# Patient Record
Sex: Female | Born: 1970 | Race: White | Hispanic: No | Marital: Married | Smoking: Never smoker
Health system: Southern US, Community
[De-identification: ages and names within clinical notes are randomized; demographics above are authoritative.]

---

## 1998-12-17 ENCOUNTER — Other Ambulatory Visit: Admission: RE | Admit: 1998-12-17 | Discharge: 1998-12-17 | Payer: Self-pay | Admitting: *Deleted

## 2000-02-09 ENCOUNTER — Other Ambulatory Visit: Admission: RE | Admit: 2000-02-09 | Discharge: 2000-02-09 | Payer: Self-pay | Admitting: Obstetrics & Gynecology

## 2001-02-08 ENCOUNTER — Other Ambulatory Visit: Admission: RE | Admit: 2001-02-08 | Discharge: 2001-02-08 | Payer: Self-pay | Admitting: Obstetrics & Gynecology

## 2001-07-31 ENCOUNTER — Encounter: Admission: RE | Admit: 2001-07-31 | Discharge: 2001-10-29 | Payer: Self-pay | Admitting: Family Medicine

## 2002-02-28 ENCOUNTER — Other Ambulatory Visit: Admission: RE | Admit: 2002-02-28 | Discharge: 2002-02-28 | Payer: Self-pay | Admitting: Obstetrics & Gynecology

## 2003-05-25 ENCOUNTER — Other Ambulatory Visit: Admission: RE | Admit: 2003-05-25 | Discharge: 2003-05-25 | Payer: Self-pay | Admitting: Obstetrics & Gynecology

## 2007-07-31 ENCOUNTER — Ambulatory Visit (HOSPITAL_COMMUNITY): Admission: RE | Admit: 2007-07-31 | Discharge: 2007-07-31 | Payer: Self-pay | Admitting: Obstetrics & Gynecology

## 2011-03-14 ENCOUNTER — Inpatient Hospital Stay (HOSPITAL_COMMUNITY): Payer: Managed Care, Other (non HMO)

## 2011-03-14 ENCOUNTER — Inpatient Hospital Stay (HOSPITAL_COMMUNITY)
Admission: AD | Admit: 2011-03-14 | Discharge: 2011-03-23 | DRG: 775 | Disposition: A | Discharge status: Home / Self Care | Payer: Managed Care, Other (non HMO) | Source: Ambulatory Visit | Attending: Obstetrics and Gynecology | Admitting: Obstetrics and Gynecology

## 2011-03-14 DIAGNOSIS — N312 Flaccid neuropathic bladder, not elsewhere classified: Secondary | ICD-10-CM | POA: Diagnosis not present

## 2011-03-14 DIAGNOSIS — O09529 Supervision of elderly multigravida, unspecified trimester: Secondary | ICD-10-CM | POA: Diagnosis present

## 2011-03-14 DIAGNOSIS — O4290 Premature rupture of membranes, unspecified as to length of time between rupture and onset of labor, unspecified weeks of gestation: Principal | ICD-10-CM | POA: Diagnosis present

## 2011-03-14 LAB — DIFFERENTIAL
Basophils Relative: 0 % (ref 0–1)
Blasts: 0 %
Eosinophils Relative: 0 % (ref 0–5)
Lymphs Abs: 1.5 10*3/uL (ref 0.7–4.0)
Monocytes Absolute: 0.5 10*3/uL (ref 0.1–1.0)
Neutrophils Relative %: 73 % (ref 43–77)
Promyelocytes Absolute: 0 %

## 2011-03-14 LAB — CBC
HCT: 37.2 % (ref 36.0–46.0)
MCH: 31.6 pg (ref 26.0–34.0)
MCHC: 33.3 g/dL (ref 30.0–36.0)
Platelets: 223 10*3/uL (ref 150–400)
WBC: 7.7 10*3/uL (ref 4.0–10.5)

## 2011-03-15 LAB — URINE CULTURE
Colony Count: NO GROWTH
Culture: NO GROWTH

## 2011-03-15 LAB — DIFFERENTIAL
Eosinophils Absolute: 0 10*3/uL (ref 0.0–0.7)
Eosinophils Relative: 0 % (ref 0–5)
Lymphocytes Relative: 13 % (ref 12–46)
Monocytes Absolute: 0.6 10*3/uL (ref 0.1–1.0)
Monocytes Relative: 6 % (ref 3–12)
Neutro Abs: 8.4 10*3/uL — ABNORMAL HIGH (ref 1.7–7.7)
Neutrophils Relative %: 81 % — ABNORMAL HIGH (ref 43–77)

## 2011-03-15 LAB — CBC
MCHC: 32.9 g/dL (ref 30.0–36.0)
MCV: 95.1 fL (ref 78.0–100.0)
RDW: 12.9 % (ref 11.5–15.5)

## 2011-03-16 LAB — DIFFERENTIAL
Band Neutrophils: 2 % (ref 0–10)
Basophils Absolute: 0 K/uL (ref 0.0–0.1)
Basophils Relative: 0 % (ref 0–1)
Blasts: 0 %
Eosinophils Absolute: 0 K/uL (ref 0.0–0.7)
Eosinophils Relative: 0 % (ref 0–5)
Lymphocytes Relative: 22 % (ref 12–46)
Lymphs Abs: 2.5 K/uL (ref 0.7–4.0)
Metamyelocytes Relative: 0 %
Monocytes Absolute: 0.5 K/uL (ref 0.1–1.0)
Monocytes Relative: 4 % (ref 3–12)
Myelocytes: 0 %
Neutro Abs: 8.4 K/uL — ABNORMAL HIGH (ref 1.7–7.7)
Neutrophils Relative %: 72 % (ref 43–77)
Promyelocytes Absolute: 0 %
nRBC: 0 /100{WBCs}

## 2011-03-16 LAB — CBC
HCT: 32.7 % — ABNORMAL LOW (ref 36.0–46.0)
Hemoglobin: 10.8 g/dL — ABNORMAL LOW (ref 12.0–15.0)
Platelets: 222 10*3/uL (ref 150–400)

## 2011-03-16 LAB — STREP B DNA PROBE

## 2011-03-20 ENCOUNTER — Other Ambulatory Visit: Payer: Self-pay | Admitting: Obstetrics and Gynecology

## 2011-03-20 LAB — DIFFERENTIAL
Basophils Absolute: 0 10*3/uL (ref 0.0–0.1)
Basophils Relative: 0 % (ref 0–1)
Lymphocytes Relative: 23 % (ref 12–46)
Monocytes Absolute: 0.8 10*3/uL (ref 0.1–1.0)

## 2011-03-20 LAB — CBC
HCT: 37.9 % (ref 36.0–46.0)
MCH: 32.1 pg (ref 26.0–34.0)
MCHC: 34.3 g/dL (ref 30.0–36.0)
Platelets: 274 10*3/uL (ref 150–400)
RDW: 12.8 % (ref 11.5–15.5)

## 2011-03-21 LAB — CBC
HCT: 32.7 % — ABNORMAL LOW (ref 36.0–46.0)
Hemoglobin: 10.9 g/dL — ABNORMAL LOW (ref 12.0–15.0)
MCH: 31.6 pg (ref 26.0–34.0)
MCHC: 33.3 g/dL (ref 30.0–36.0)
MCV: 94.8 fL (ref 78.0–100.0)
RBC: 3.45 MIL/uL — ABNORMAL LOW (ref 3.87–5.11)
RDW: 12.9 % (ref 11.5–15.5)
WBC: 13.1 10*3/uL — ABNORMAL HIGH (ref 4.0–10.5)

## 2011-03-22 LAB — RH IMMUNE GLOB WKUP(>/=20WKS)(NOT WOMEN'S HOSP)
Antibody Screen: POSITIVE
DAT, IgG: NEGATIVE

## 2011-05-03 ENCOUNTER — Inpatient Hospital Stay (HOSPITAL_COMMUNITY): Admission: AD | Admit: 2011-05-03 | Payer: Self-pay | Source: Home / Self Care | Admitting: Obstetrics and Gynecology

## 2011-05-18 ENCOUNTER — Ambulatory Visit (HOSPITAL_COMMUNITY)
Admission: RE | Admit: 2011-05-18 | Discharge: 2011-05-18 | Disposition: A | Discharge status: Home / Self Care | Payer: Managed Care, Other (non HMO) | Source: Ambulatory Visit | Attending: Pediatrics | Admitting: Pediatrics

## 2011-05-18 NOTE — Progress Notes (Signed)
BABY:  Erin  Noble  WEIGHT TODAY  8LB 10OZ MOTHER:  Erin Noble  BABY WAS BORN AT 34 WEEKS AND NOW 52 WEEKS OLD.  MOTHER HAS BEEN PUMPING WITH GOOD MILK SUPPLY.  MOTHER C/O OF PINCHED, BLANCHED VERY SORE NIPPLES WHEN PUTTING BABY TO BREAST.  ASSISTED MOTHER WITH PROPER TECHNIQUE FOR POSITIONING AND LATCH IN CROSS CRADLE HOLD.  BABY LATCHED EASILY WITH DEEP LATCH, GREAT SUCK/SWALLOWS.  MOTHER STATES FEEDING IS COMFORTABLE AND NIPPLES NOT DISTORTED AFTER FEED.  BASIC BREASTFEEDING/PUMPING TEACHING DONE.  ENCOURAGED [name] TO CALL LC OFFICE PRN.

## 2015-02-26 ENCOUNTER — Ambulatory Visit: Payer: Managed Care, Other (non HMO) | Admitting: Cardiology

## 2015-12-24 ENCOUNTER — Other Ambulatory Visit: Payer: Self-pay | Admitting: Family Medicine

## 2015-12-24 DIAGNOSIS — R110 Nausea: Secondary | ICD-10-CM

## 2015-12-31 ENCOUNTER — Ambulatory Visit
Admission: RE | Admit: 2015-12-31 | Discharge: 2015-12-31 | Disposition: A | Discharge status: Home / Self Care | Payer: Managed Care, Other (non HMO) | Source: Ambulatory Visit | Attending: Family Medicine | Admitting: Family Medicine

## 2015-12-31 DIAGNOSIS — R110 Nausea: Secondary | ICD-10-CM

## 2016-03-15 ENCOUNTER — Encounter: Payer: Self-pay | Admitting: Endocrinology

## 2016-03-15 ENCOUNTER — Ambulatory Visit (INDEPENDENT_AMBULATORY_CARE_PROVIDER_SITE_OTHER): Payer: Managed Care, Other (non HMO) | Admitting: Endocrinology

## 2016-03-15 VITALS — BP 100/62 | HR 67 | Temp 98.5°F | Resp 14 | Ht 69.0 in | Wt 165.8 lb

## 2016-03-15 DIAGNOSIS — R5383 Other fatigue: Secondary | ICD-10-CM | POA: Diagnosis not present

## 2016-03-15 DIAGNOSIS — I9589 Other hypotension: Secondary | ICD-10-CM

## 2016-03-15 DIAGNOSIS — R531 Weakness: Secondary | ICD-10-CM | POA: Diagnosis not present

## 2016-03-15 LAB — RENAL FUNCTION PANEL
Albumin: 4.2 g/dL (ref 3.5–5.2)
BUN: 8 mg/dL (ref 6–23)
CHLORIDE: 105 meq/L (ref 96–112)
CO2: 29 mEq/L (ref 19–32)
Calcium: 9.2 mg/dL (ref 8.4–10.5)
Creatinine, Ser: 0.61 mg/dL (ref 0.40–1.20)
GFR: 112.22 mL/min (ref >60.00)
GLUCOSE: 90 mg/dL (ref 70–99)
PHOSPHORUS: 3.2 mg/dL (ref 2.3–4.6)
Potassium: 4 mEq/L (ref 3.5–5.1)
SODIUM: 139 meq/L (ref 135–145)

## 2016-03-15 LAB — T4, FREE: FREE T4: 0.7 ng/dL (ref 0.60–1.60)

## 2016-03-15 LAB — CORTISOL: CORTISOL PLASMA: 7.1 ug/dL

## 2016-03-15 NOTE — Progress Notes (Signed)
Quick Note:  Please let patient know that the lab result is normal and no further action needed ______ 

## 2016-03-15 NOTE — Progress Notes (Signed)
Patient ID: Erin Noble, female   DOB: [birth year], 45 y.o.   MRN: [id number]           Chief complaint: Episodes of nausea and fatigue  History of Present Illness:  Over the last couple of years she had had intermittent episodes of feeling nauseated and a little weight. Since March this year she has had much more frequent episodes starting initially with an episode that lasted for a few days. She usually starts having symptoms of feeling nauseated, shaky, very tired but not sweaty or lightheaded.  She apparently feels much better with eating any kind of food With having the symptoms she has started eating some food or snacks every 2 hours or so and with this she thinks she feels better. However even though she has occasional symptoms early morning she can sometimes feel fine with without any food when she is not supposed to eat for procedure when she came in today  Her PCP asked her to start checking her blood sugars at various times and these have been ranging between 84 and 116 with or without meals and not correlating with her symptoms Her symptoms are somewhat better overall except having some symptoms of feeling weak and tired at times and occasional fluttering of her heart Sometimes she will have symptoms even 30-60 minutes after a meal or snack which may contain protein   No past medical history on file.  No past surgical history on file.  Family History  Problem Relation Age of Onset  . Thyroid cancer Mother   . Diabetes Father     Social History:  reports that she has never smoked. She has never used smokeless tobacco. Her alcohol and drug histories are not on file.  Allergies:  Allergies  Allergen Reactions  . Codeine Other (See Comments)  . Erythromycin Nausea And Vomiting      Medication List       This list is accurate as of: 03/15/16 11:59 PM.  Always use your most recent med list.               ALPRAZolam 0.25 MG tablet  Commonly known as:  XANAX       ALPRAZolam 0.5 MG tablet  Commonly known as:  XANAX  TK 1 T PO TID FOR 30 DAYS     omeprazole 20 MG capsule  Commonly known as:  PRILOSEC     ZOFRAN ODT 8 MG disintegrating tablet  Generic drug:  ondansetron  Take by mouth.        LABS:  Office Visit on 03/15/2016  Component Date Value Ref Range Status  . PTH 03/15/2016 25  15 - 65 pg/mL Final  . Sodium 03/15/2016 139  135 - 145 mEq/L Final  . Potassium 03/15/2016 4.0  3.5 - 5.1 mEq/L Final  . Chloride 03/15/2016 105  96 - 112 mEq/L Final  . CO2 03/15/2016 29  19 - 32 mEq/L Final  . Calcium 03/15/2016 9.2  8.4 - 10.5 mg/dL Final  . Albumin 40/98/1191 4.2  3.5 - 5.2 g/dL Final  . BUN 47/82/9562 8  6 - 23 mg/dL Final  . Creatinine, Ser 03/15/2016 0.61  0.40 - 1.20 mg/dL Final  . Glucose, Bld 13/05/6577 90  70 - 99 mg/dL Final  . Phosphorus 46/96/2952 3.2  2.3 - 4.6 mg/dL Final  . GFR 84/13/2440 112.22  >60.00 mL/min Final  . Free T4 03/15/2016 0.70  0.60 - 1.60 ng/dL Final  . Cortisol, Plasma 03/15/2016 7.1  Final   AM:  4.3 - 22.4 ug/dLPM:  3.1 - 16.7 ug/dL     REVIEW OF SYSTEMS:        Review of Systems  Constitutional: Positive for malaise. Negative for weight gain.  Cardiovascular: Positive for palpitations.  Gastrointestinal: Positive for nausea.  Endocrine: Negative for menstrual changes.       She usually has menstrual cycles about every 23 days  Genitourinary: Negative for frequency.  Musculoskeletal: Negative for muscle aches.  Skin: Negative for rash.  Neurological: Negative for numbness and tingling.  Psychiatric/Behavioral: Positive for nervousness.     PHYSICAL EXAM:  BP 100/62 mmHg  Pulse 67  Temp(Src) 98.5 F (36.9 C)  Resp 14  Ht  (1.753 m)  Wt 165 lb 12.8 oz (75.206 kg)  BMI 24.47 kg/m2  SpO2 98%  Standing blood pressure 100/68, Unchanged compared to setting blood pressure  GENERAL: She looks well.  Well groomed and nourished  No pallor, clubbing  or edema.   Skin:  no  rash or pigmentation.  EYES:  Externally normal.    ENT: Oral mucosa and tongue normal.  No oral pigmentation   THYROID:  Not palpable. No cervical lymphadenopathy  HEART:  Normal  S1 and S2; no murmur or click.  CHEST:  Normal shape.  Lungs: Vescicular breath sounds heard equally.  No crepitations/ wheeze.  ABDOMEN:  No distention.  Liver and spleen not palpable.  No other mass, mild tenderness in the lower abdominal quadrants laterally  NEUROLOGICAL: .Reflexes are bilaterally normal at ankles.  JOINTS:  Normal.   ASSESSMENT:   Intermittent symptoms of nausea, shakiness, marked fatigue and palpitations of unclear etiology.   Apparently she does appear to improve with frequent small meals and not clear how this is helping her symptoms including nausea Does not have any typical symptoms suggestive of hypoglycemia and has no abnormal blood sugars on home glucose monitoring when she has symptoms Occasionally still has symptoms 30 minutes after eating.   She also reports some palpitations at times related by deep breathing exercises suggesting anxiety  She does not have any consistent symptoms and unlikely has any underlying endocrine problem   PLAN:    Discussed with the patient that her symptoms appear not to be related to any endocrine problem She does not have any features of adrenal insufficiency with no weight loss, change in appetite, persistent nausea or vomiting or objective signs of orthostatic hypotension, pigmentation or pallor.  Evening cortisol previously was quite normal We will recheck cortisol today  Most likely patient has been suggestive of anxiety or panic attacks  She may have some rather reactive hypoglycemia type symptoms with relief with food but also at times has symptoms within 30 minutes of eating food including protein  Encouraged her to try and reduce carbohydrate intake and have a protein at every meal She does not need to check her blood sugars at  home  Since she has a higher calcium level recently will recheck this along with PTH Also will check her free T4 level to rule out secondary hypothyroidism   Erin Noble 03/16/2016, 8:41 AM    Addendum: Labs normal, follow-up with PCP  Office Visit on 03/15/2016  Component Date Value Ref Range Status  . PTH 03/15/2016 25  15 - 65 pg/mL Final  . Sodium 03/15/2016 139  135 - 145 mEq/L Final  . Potassium 03/15/2016 4.0  3.5 - 5.1 mEq/L Final  . Chloride 03/15/2016 105  96 - 112 mEq/L Final  .  CO2 03/15/2016 29  19 - 32 mEq/L Final  . Calcium 03/15/2016 9.2  8.4 - 10.5 mg/dL Final  . Albumin 16/10/960405/31/2017 4.2  3.5 - 5.2 g/dL Final  . BUN 54/09/811905/31/2017 8  6 - 23 mg/dL Final  . Creatinine, Ser 03/15/2016 0.61  0.40 - 1.20 mg/dL Final  . Glucose, Bld 14/78/295605/31/2017 90  70 - 99 mg/dL Final  . Phosphorus 21/30/865705/31/2017 3.2  2.3 - 4.6 mg/dL Final  . GFR 84/69/629505/31/2017 112.22  >60.00 mL/min Final  . Free T4 03/15/2016 0.70  0.60 - 1.60 ng/dL Final  . Cortisol, Plasma 03/15/2016 7.1   Final   AM:  4.3 - 22.4 ug/dLPM:  3.1 - 16.7 ug/dL

## 2016-03-16 ENCOUNTER — Encounter: Payer: Self-pay | Admitting: Endocrinology

## 2016-03-16 LAB — PARATHYROID HORMONE, INTACT (NO CA): PTH: 25 pg/mL (ref 15–65)

## 2016-06-20 ENCOUNTER — Other Ambulatory Visit: Payer: Self-pay | Admitting: Family Medicine

## 2016-06-20 DIAGNOSIS — K769 Liver disease, unspecified: Secondary | ICD-10-CM

## 2016-07-05 ENCOUNTER — Ambulatory Visit
Admission: RE | Admit: 2016-07-05 | Discharge: 2016-07-05 | Disposition: A | Discharge status: Home / Self Care | Payer: Managed Care, Other (non HMO) | Source: Ambulatory Visit | Attending: Family Medicine | Admitting: Family Medicine

## 2016-07-05 DIAGNOSIS — K769 Liver disease, unspecified: Secondary | ICD-10-CM

## 2016-10-07 IMAGING — US US ABDOMEN COMPLETE
1 series · 13 of 25 positions shown · non-contrast
Comparison: None.

CLINICAL DATA: Nausea.

EXAM:
ABDOMEN ULTRASOUND COMPLETE

[Series 1: us abdomen complete · 0.32mm/px · 13 of 113 slices shown]
[im 1/113]
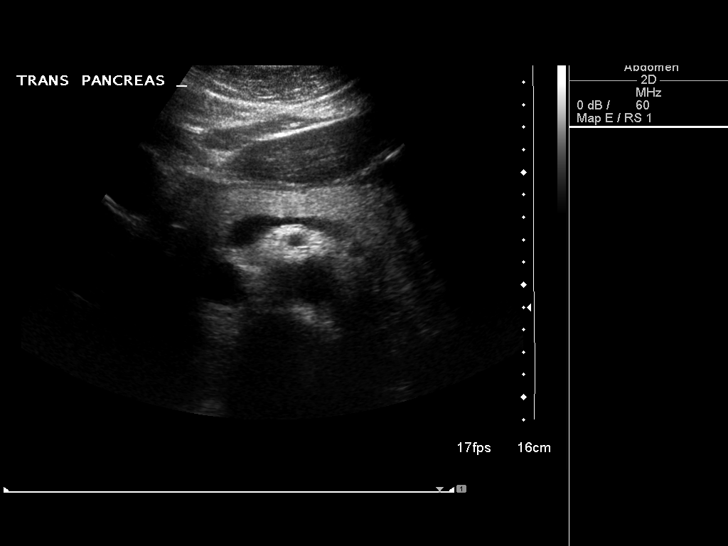
[im 10/113]
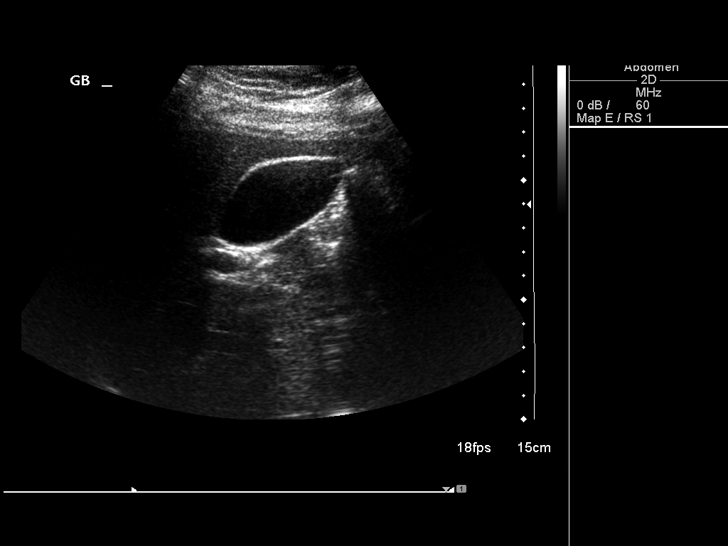
[im 19/113]
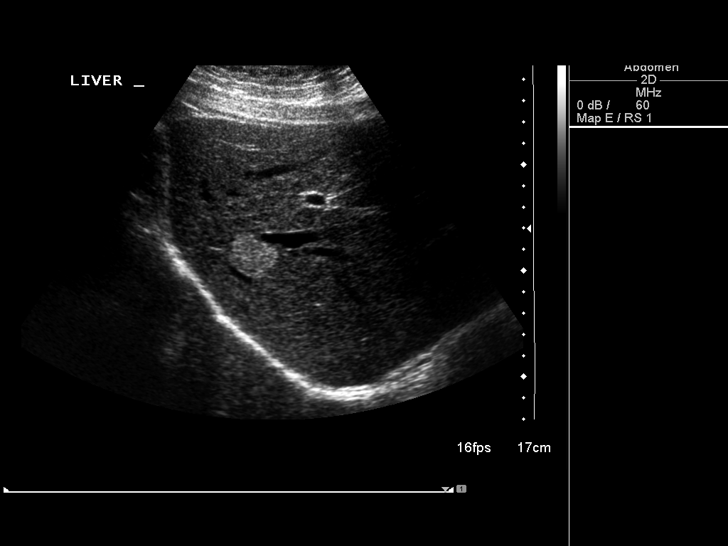
[im 29/113]
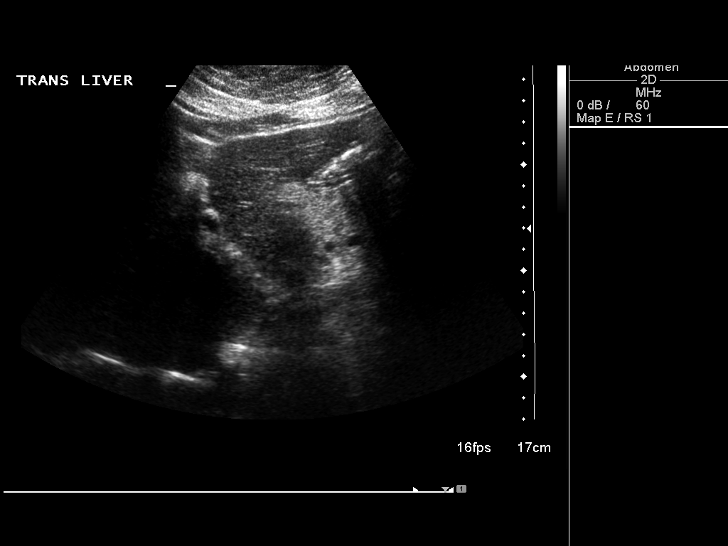
[im 38/113]
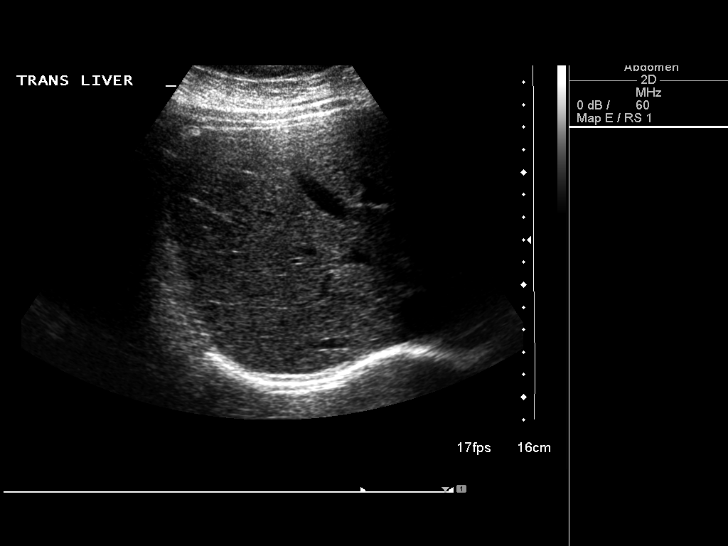
[im 47/113]
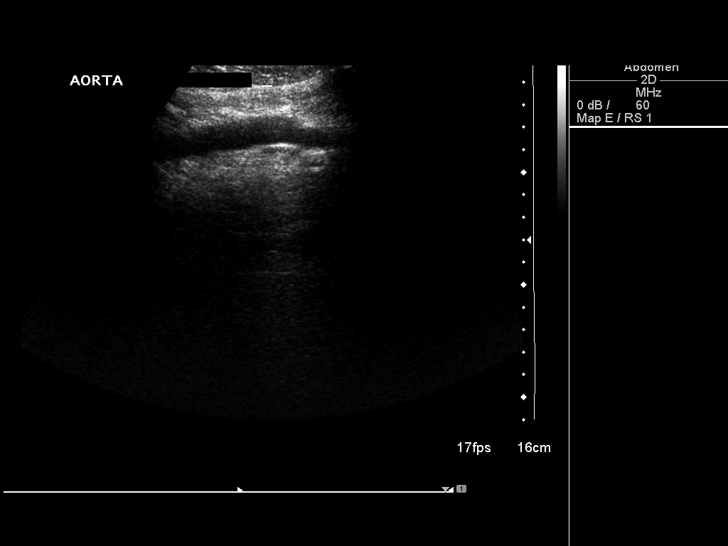
[im 57/113]
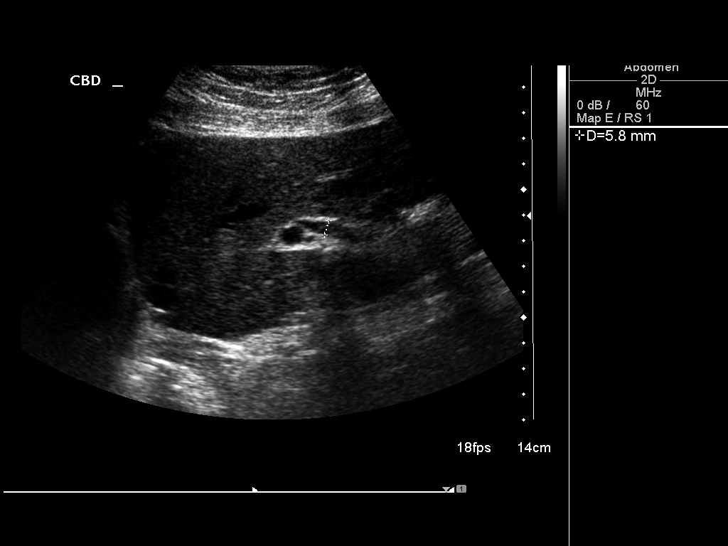
[im 66/113]
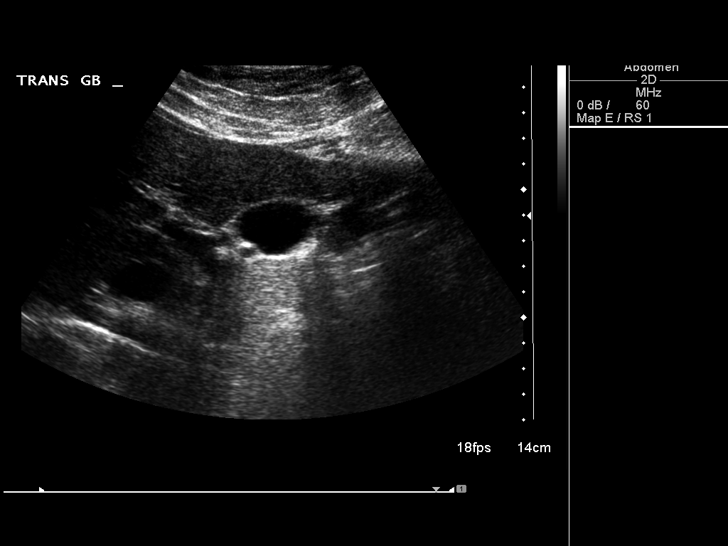
[im 75/113]
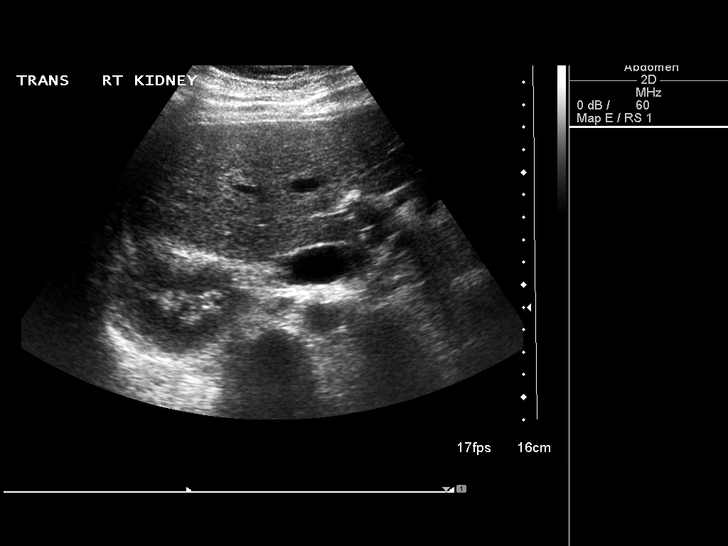
[im 85/113]
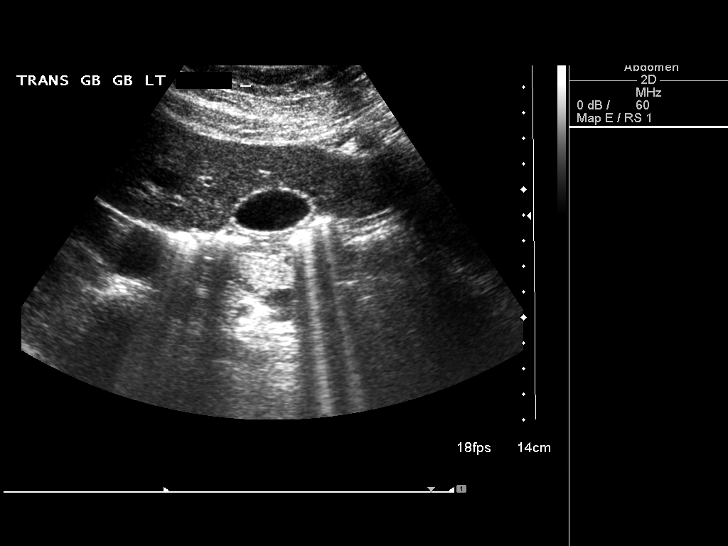
[im 94/113]
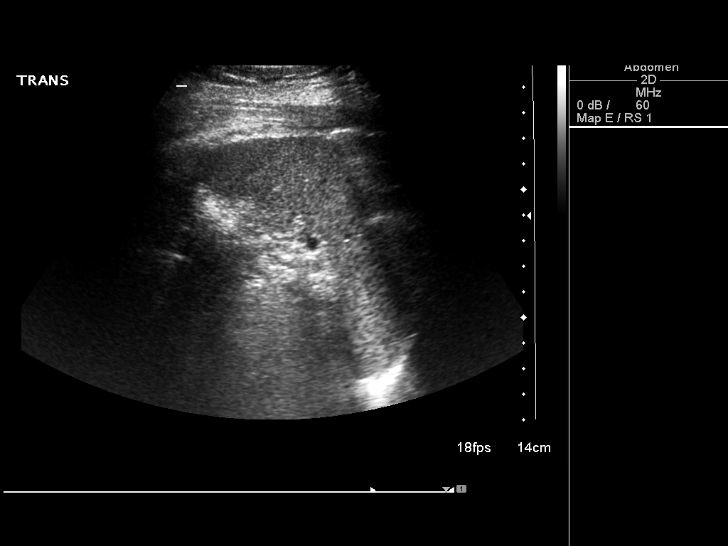
[im 103/113]
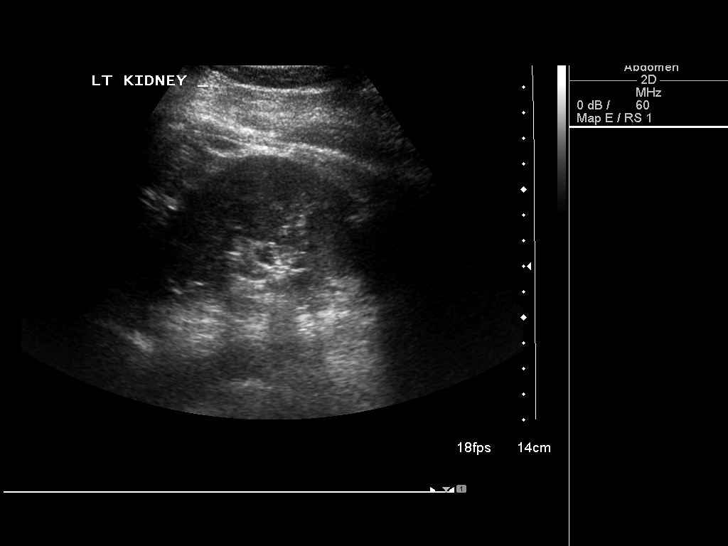
[im 113/113]
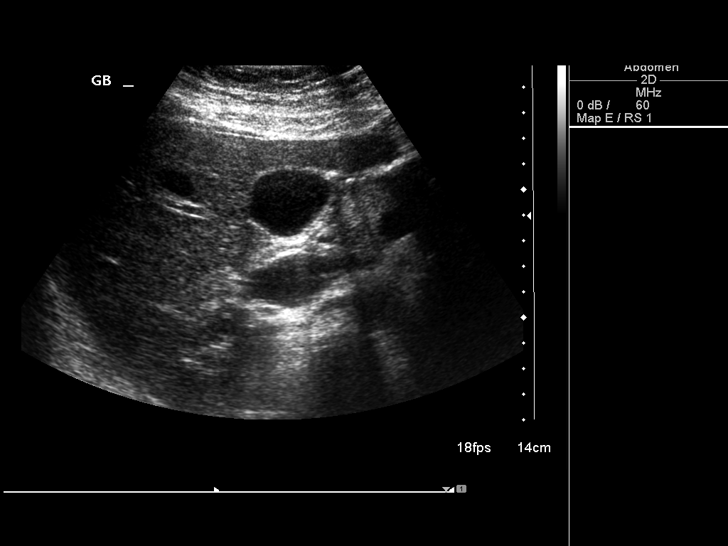

[13 of 25 positions shown; findings below may reference images not displayed]

FINDINGS: Gallbladder: No gallstones or wall thickening visualized. No
sonographic Murphy sign noted by sonographer.

Common bile duct: Diameter: 7 mm. The common bile duct is mildly
prominent in caliber. No evidence of visible ductal calculi by
ultrasound.

Liver: There is a well-circumscribed echogenic lesion in the
superior right lobe measuring approximately 2.4 x 2.3 x 2.3 cm. This
most likely represents hemangioma by appearance. See recommendations
in the impression of the report.

IVC: No abnormality visualized.

Pancreas: Visualized portion unremarkable.

Spleen: Size and appearance within normal limits.

Right Kidney: Length: 12.4 cm. Echogenicity within normal limits. No
mass or hydronephrosis visualized.

Left Kidney: Length: 10.6 cm. Echogenicity within normal limits. No
mass or hydronephrosis visualized.

Abdominal aorta: No aneurysm visualized.

Other findings: None.
IMPRESSION: 2.4 cm well-defined echogenic lesion in the right lobe of the liver
most likely representing hemangioma. As there is no prior imaging of
this lesion, follow-up ultrasound is suggested in 6 months. If the
patient has a history of cancer, MRI evaluation of the abdomen is
recommended with and without gadolinium.

## 2017-04-12 IMAGING — US US ABDOMEN COMPLETE
1 series · 14 of 25 positions shown · non-contrast
Comparison: Ultrasound 12/31/2015.

CLINICAL DATA: Follow-up liver lesion.

EXAM:
ABDOMEN ULTRASOUND COMPLETE

[Series 1: us abdomen complete · 0.27mm/px · 14 of 85 slices shown]
[im 1/85]
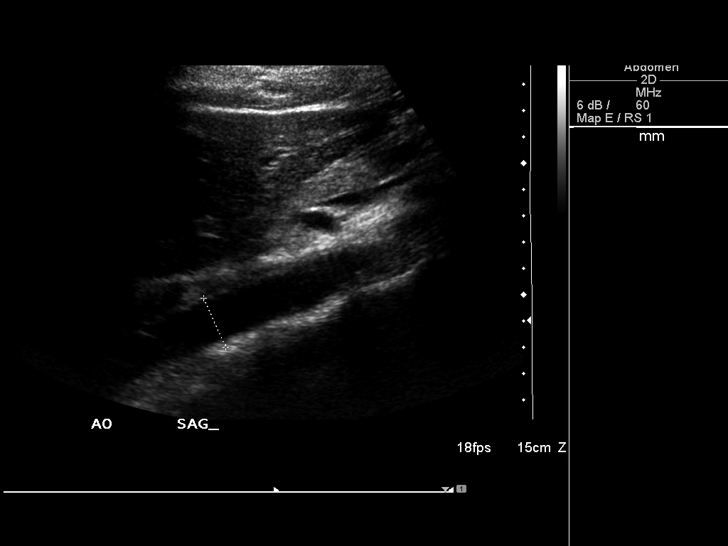
[im 8/85]
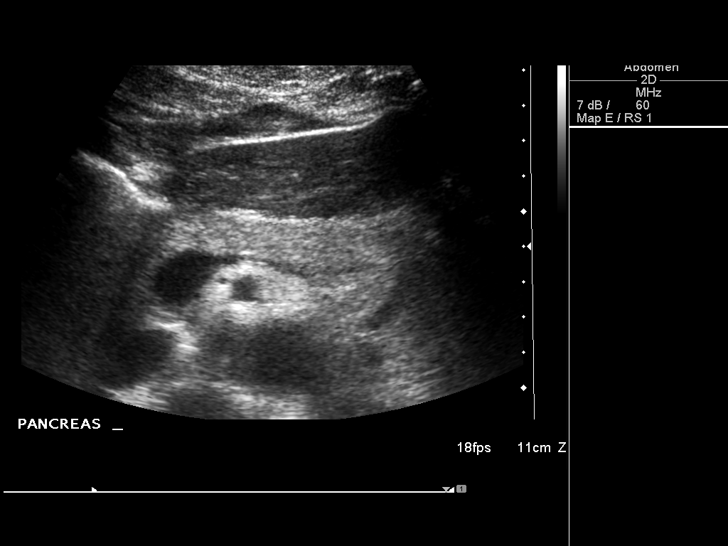
[im 15/85]
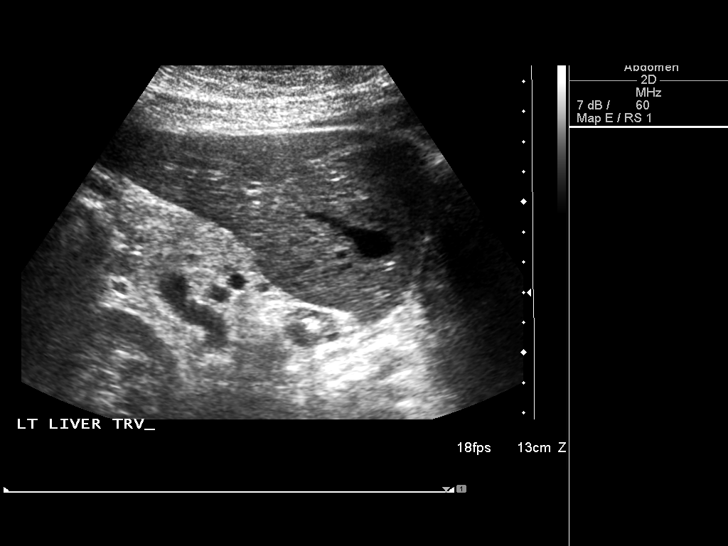
[im 22/85]
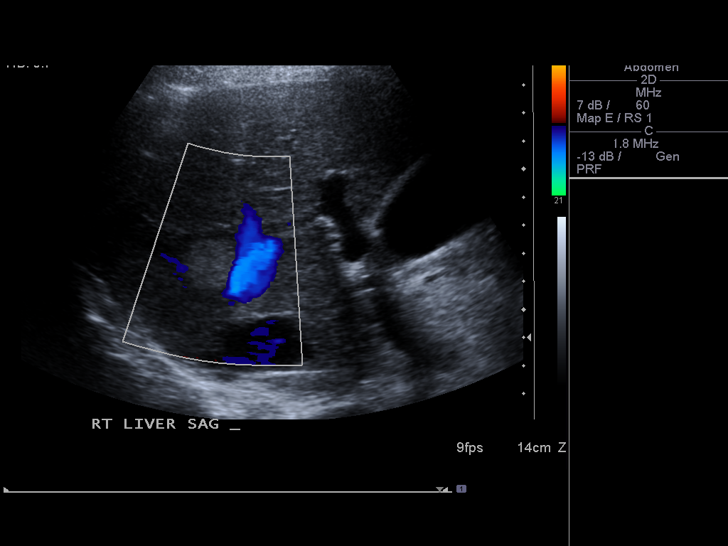
[im 29/85]
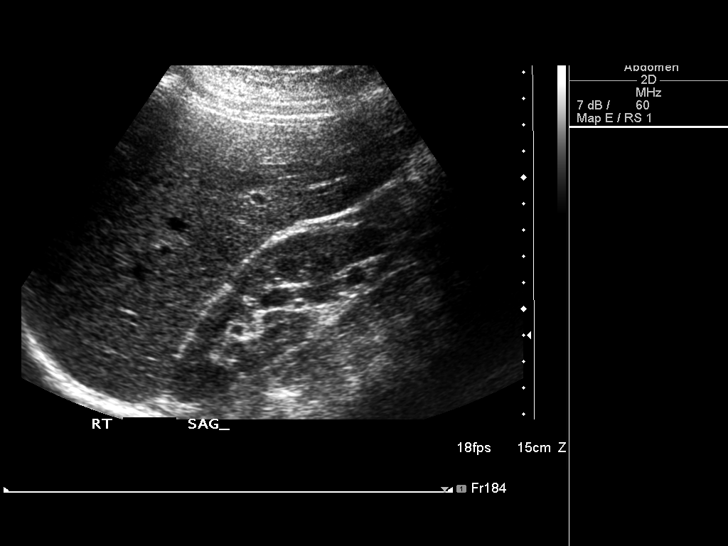
[im 32/85]
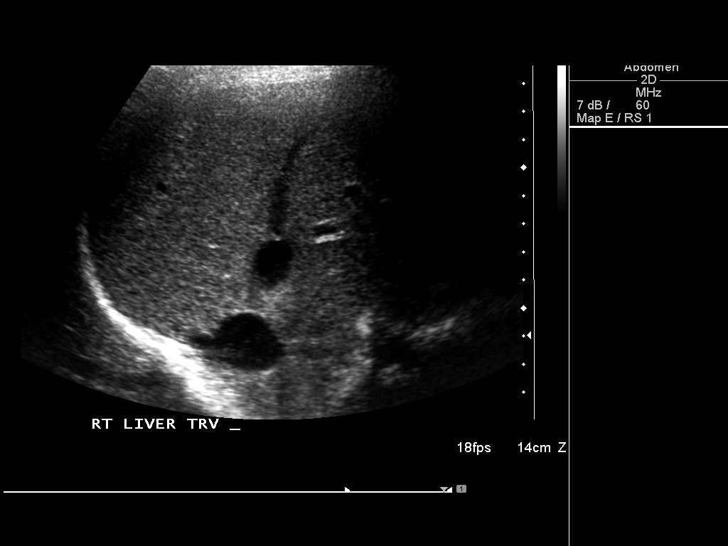
[im 39/85]
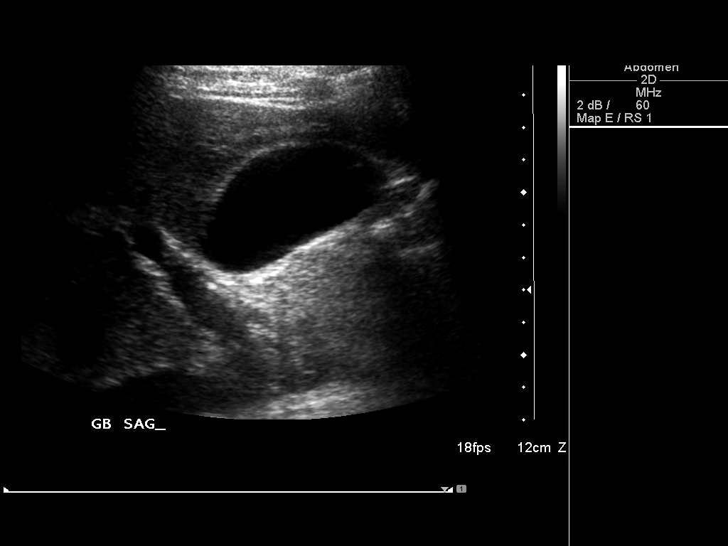
[im 46/85]
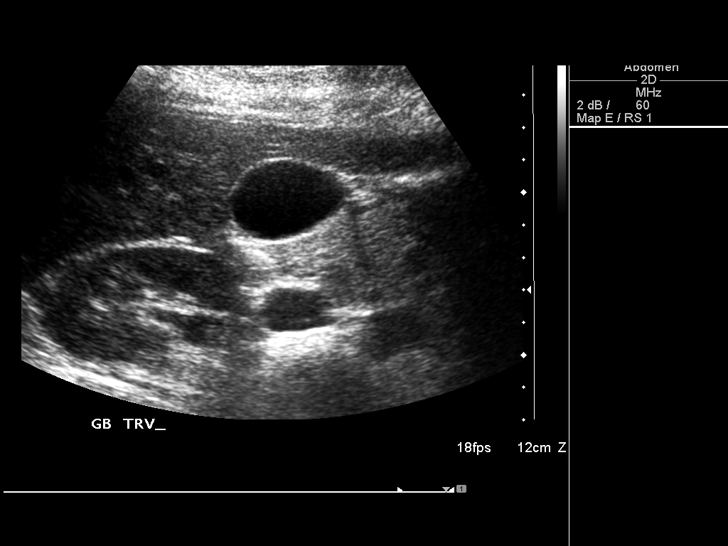
[im 53/85]
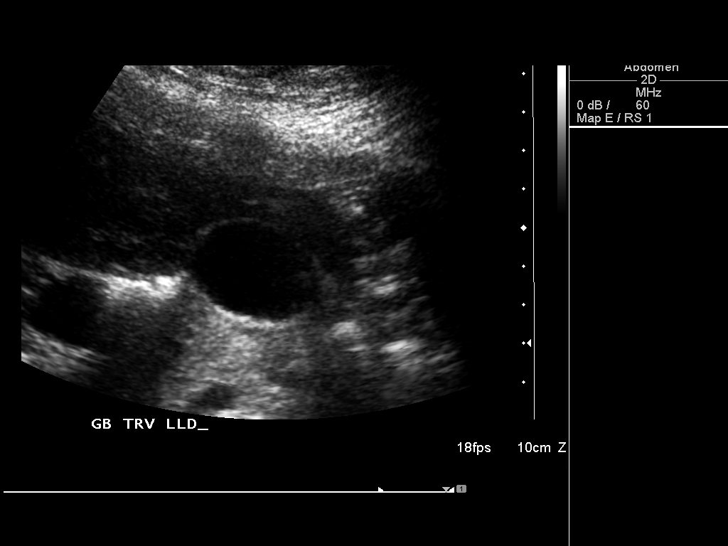
[im 57/85]
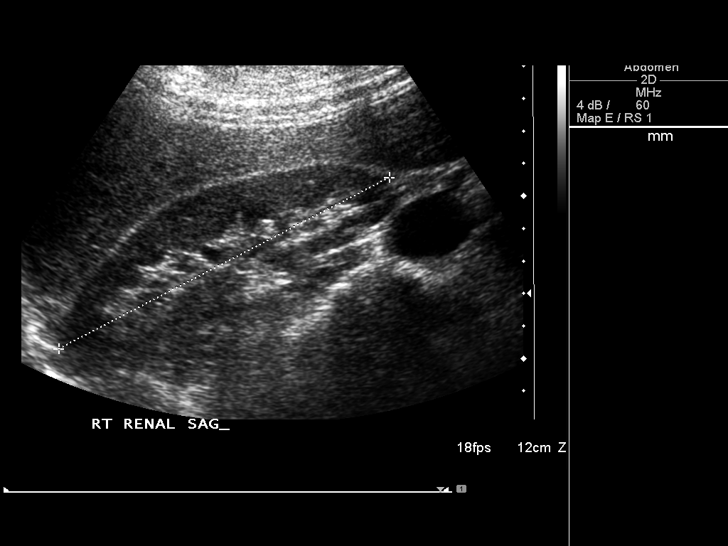
[im 64/85]
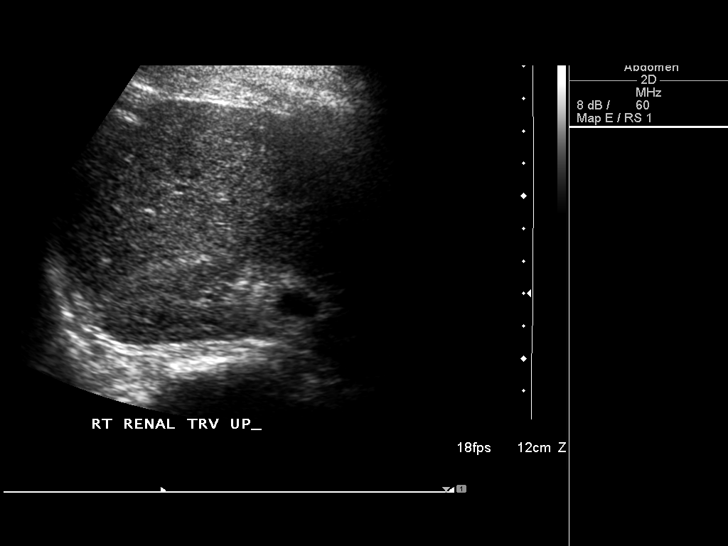
[im 71/85]
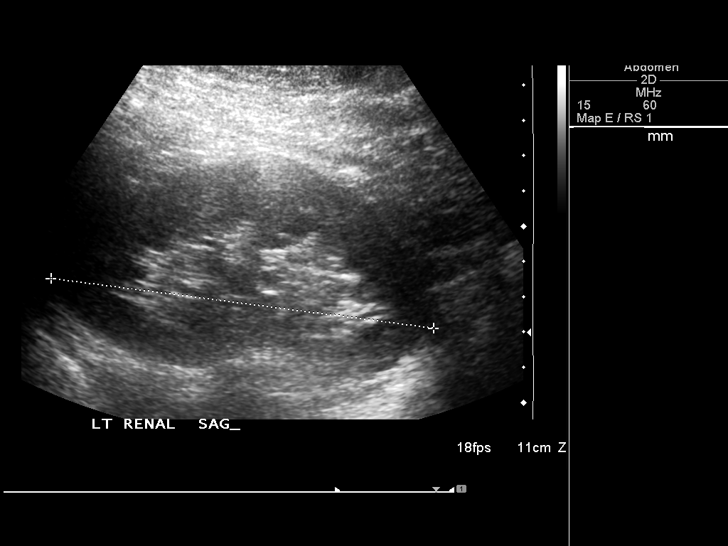
[im 78/85]
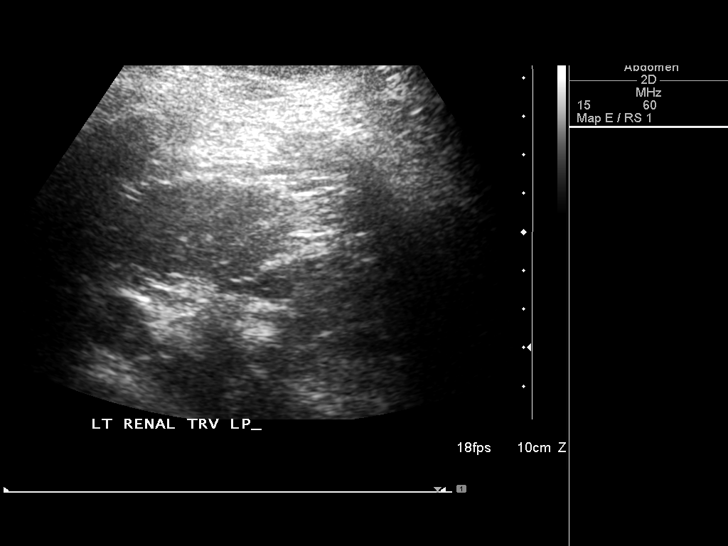
[im 85/85]
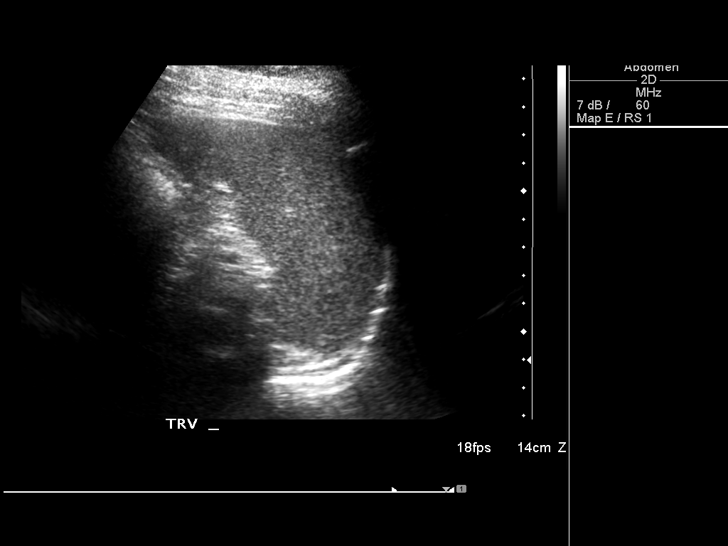

[14 of 25 positions shown; findings below may reference images not displayed]

FINDINGS: Gallbladder: No gallstones or wall thickening visualized. No
sonographic Murphy sign noted by sonographer.

Common bile duct: Diameter: 4.4 mm

Liver: A 2.3 x 2.0 x 2.2 cm echogenic focus is in the right lobe of
the liver. Similar finding noted on prior exam. Although malignancy
cannot be excluded, this is most likely a benign hemangioma.

IVC: No abnormality visualized.

Pancreas: Visualized portion unremarkable.

Spleen: Size and appearance within normal limits.

Right Kidney: Length: 12.0 cm. Echogenicity within normal limits. No
mass or hydronephrosis visualized.

Left Kidney: Length: 10.9 cm. Echogenicity within normal limits. No
mass or hydronephrosis visualized.

Abdominal aorta: No aneurysm visualized.

Other findings: None.
IMPRESSION: 1. Stable echogenic focus noted in the right lobe liver most
consistent with a benign hemangioma.

2. No acute abnormality .

## 2020-01-24 ENCOUNTER — Ambulatory Visit: Payer: Managed Care, Other (non HMO) | Attending: Internal Medicine

## 2020-01-24 DIAGNOSIS — Z23 Encounter for immunization: Secondary | ICD-10-CM

## 2020-01-24 NOTE — Progress Notes (Signed)
   Covid-19 Vaccination Clinic  Name:  Erin Noble    MRN: [id number] DOB: 1971-03-20  01/24/2020  Erin Noble was observed post Covid-19 immunization for 15 minutes without incident. She was provided with Vaccine Information Sheet and instruction to access the V-Safe system.   Erin Noble was instructed to call 911 with any severe reactions post vaccine: Marland Kitchen Difficulty breathing  . Swelling of face and throat  . A fast heartbeat  . A bad rash all over body  . Dizziness and weakness   Immunizations Administered    Name Date Dose VIS Date Route   Pfizer COVID-19 Vaccine 01/24/2020 10:31 AM 0.3 mL 09/26/2019 Intramuscular   Manufacturer: [employer], [employer]   Lot: [id number]   NDC: [address]-9

## 2020-02-16 ENCOUNTER — Ambulatory Visit: Payer: Managed Care, Other (non HMO) | Attending: Internal Medicine

## 2020-02-16 DIAGNOSIS — Z23 Encounter for immunization: Secondary | ICD-10-CM

## 2020-02-16 NOTE — Progress Notes (Signed)
   Covid-19 Vaccination Clinic  Name:  Erin Noble    MRN: [id number] DOB: 1971/07/11  02/16/2020  [name] was observed post Covid-19 immunization for 15 minutes without incident. She was provided with Vaccine Information Sheet and instruction to access the V-Safe system.   [name] was instructed to call 911 with any severe reactions post vaccine: Marland Kitchen Difficulty breathing  . Swelling of face and throat  . A fast heartbeat  . A bad rash all over body  . Dizziness and weakness   Immunizations Administered    Name Date Dose VIS Date Route   Pfizer COVID-19 Vaccine 02/16/2020 12:38 PM 0.3 mL 12/10/2018 Intramuscular   Manufacturer: [employer], [employer]   Lot: [id number]   NDC: [address]-4

## 2024-05-16 ENCOUNTER — Encounter: Payer: Self-pay | Admitting: Family Medicine
